# Patient Record
Sex: Female | Born: 1955 | Race: White | Hispanic: No | Marital: Married | Smoking: Never smoker
Health system: Southern US, Community
[De-identification: ages and names within clinical notes are randomized; demographics above are authoritative.]

---

## 1998-01-14 ENCOUNTER — Other Ambulatory Visit: Admission: RE | Admit: 1998-01-14 | Discharge: 1998-01-14 | Payer: Self-pay | Admitting: Gynecology

## 1999-04-12 ENCOUNTER — Other Ambulatory Visit: Admission: RE | Admit: 1999-04-12 | Discharge: 1999-04-12 | Payer: Self-pay | Admitting: Gynecology

## 2000-04-03 ENCOUNTER — Other Ambulatory Visit: Admission: RE | Admit: 2000-04-03 | Discharge: 2000-04-03 | Payer: Self-pay | Admitting: Gynecology

## 2001-08-27 ENCOUNTER — Other Ambulatory Visit: Admission: RE | Admit: 2001-08-27 | Discharge: 2001-08-27 | Payer: Self-pay | Admitting: Gynecology

## 2002-09-04 ENCOUNTER — Other Ambulatory Visit: Admission: RE | Admit: 2002-09-04 | Discharge: 2002-09-04 | Payer: Self-pay | Admitting: Gynecology

## 2002-09-17 ENCOUNTER — Emergency Department (HOSPITAL_COMMUNITY): Admission: EM | Admit: 2002-09-17 | Discharge: 2002-09-17 | Payer: Self-pay | Admitting: Emergency Medicine

## 2002-09-17 ENCOUNTER — Encounter: Payer: Self-pay | Admitting: Emergency Medicine

## 2003-09-17 ENCOUNTER — Other Ambulatory Visit: Admission: RE | Admit: 2003-09-17 | Discharge: 2003-09-17 | Payer: Self-pay | Admitting: Gynecology

## 2005-02-02 ENCOUNTER — Other Ambulatory Visit: Admission: RE | Admit: 2005-02-02 | Discharge: 2005-02-02 | Payer: Self-pay | Admitting: Gynecology

## 2008-05-11 ENCOUNTER — Other Ambulatory Visit: Admission: RE | Admit: 2008-05-11 | Discharge: 2008-05-11 | Payer: Self-pay | Admitting: Family Medicine

## 2009-08-11 ENCOUNTER — Encounter: Admission: RE | Admit: 2009-08-11 | Discharge: 2009-08-11 | Payer: Self-pay | Admitting: Family Medicine

## 2009-08-25 ENCOUNTER — Encounter: Admission: RE | Admit: 2009-08-25 | Discharge: 2009-08-25 | Payer: Self-pay | Admitting: Family Medicine

## 2011-09-06 ENCOUNTER — Other Ambulatory Visit: Payer: Self-pay | Admitting: Family Medicine

## 2011-09-06 ENCOUNTER — Other Ambulatory Visit (HOSPITAL_COMMUNITY)
Admission: RE | Admit: 2011-09-06 | Discharge: 2011-09-06 | Disposition: A | Discharge status: Home / Self Care | Payer: BC Managed Care – PPO | Source: Ambulatory Visit | Attending: Family Medicine | Admitting: Family Medicine

## 2011-09-06 DIAGNOSIS — Z124 Encounter for screening for malignant neoplasm of cervix: Secondary | ICD-10-CM | POA: Insufficient documentation

## 2014-10-05 ENCOUNTER — Other Ambulatory Visit (HOSPITAL_COMMUNITY)
Admission: RE | Admit: 2014-10-05 | Discharge: 2014-10-05 | Disposition: A | Discharge status: Home / Self Care | Payer: BLUE CROSS/BLUE SHIELD | Source: Ambulatory Visit | Attending: Family Medicine | Admitting: Family Medicine

## 2014-10-05 ENCOUNTER — Other Ambulatory Visit: Payer: Self-pay | Admitting: Family Medicine

## 2014-10-05 DIAGNOSIS — Z124 Encounter for screening for malignant neoplasm of cervix: Secondary | ICD-10-CM | POA: Diagnosis present

## 2014-10-06 LAB — CYTOLOGY - PAP

## 2016-01-05 ENCOUNTER — Other Ambulatory Visit: Payer: Self-pay | Admitting: Family Medicine

## 2016-01-05 ENCOUNTER — Other Ambulatory Visit (HOSPITAL_COMMUNITY)
Admission: RE | Admit: 2016-01-05 | Discharge: 2016-01-05 | Disposition: A | Discharge status: Home / Self Care | Payer: BLUE CROSS/BLUE SHIELD | Source: Ambulatory Visit | Attending: Family Medicine | Admitting: Family Medicine

## 2016-01-05 DIAGNOSIS — Z124 Encounter for screening for malignant neoplasm of cervix: Secondary | ICD-10-CM | POA: Diagnosis not present

## 2016-01-07 LAB — CYTOLOGY - PAP

## 2017-06-06 DIAGNOSIS — H6122 Impacted cerumen, left ear: Secondary | ICD-10-CM | POA: Diagnosis not present

## 2017-06-06 DIAGNOSIS — H6982 Other specified disorders of Eustachian tube, left ear: Secondary | ICD-10-CM | POA: Diagnosis not present

## 2017-08-03 DIAGNOSIS — H9210 Otorrhea, unspecified ear: Secondary | ICD-10-CM | POA: Diagnosis not present

## 2017-08-03 DIAGNOSIS — H9192 Unspecified hearing loss, left ear: Secondary | ICD-10-CM | POA: Diagnosis not present

## 2017-08-03 DIAGNOSIS — H6122 Impacted cerumen, left ear: Secondary | ICD-10-CM | POA: Diagnosis not present

## 2017-08-03 DIAGNOSIS — F419 Anxiety disorder, unspecified: Secondary | ICD-10-CM | POA: Diagnosis not present

## 2017-08-03 DIAGNOSIS — J3489 Other specified disorders of nose and nasal sinuses: Secondary | ICD-10-CM | POA: Diagnosis not present

## 2017-08-03 DIAGNOSIS — B369 Superficial mycosis, unspecified: Secondary | ICD-10-CM | POA: Diagnosis not present

## 2017-08-03 DIAGNOSIS — H6240 Otitis externa in other diseases classified elsewhere, unspecified ear: Secondary | ICD-10-CM | POA: Diagnosis not present

## 2017-08-27 DIAGNOSIS — B369 Superficial mycosis, unspecified: Secondary | ICD-10-CM | POA: Diagnosis not present

## 2017-08-27 DIAGNOSIS — J342 Deviated nasal septum: Secondary | ICD-10-CM | POA: Diagnosis not present

## 2017-08-27 DIAGNOSIS — H6983 Other specified disorders of Eustachian tube, bilateral: Secondary | ICD-10-CM | POA: Diagnosis not present

## 2017-08-27 DIAGNOSIS — H938X3 Other specified disorders of ear, bilateral: Secondary | ICD-10-CM | POA: Diagnosis not present

## 2017-08-27 DIAGNOSIS — J343 Hypertrophy of nasal turbinates: Secondary | ICD-10-CM | POA: Diagnosis not present

## 2017-11-02 DIAGNOSIS — Z1159 Encounter for screening for other viral diseases: Secondary | ICD-10-CM | POA: Diagnosis not present

## 2017-11-02 DIAGNOSIS — J309 Allergic rhinitis, unspecified: Secondary | ICD-10-CM | POA: Diagnosis not present

## 2017-11-02 DIAGNOSIS — F419 Anxiety disorder, unspecified: Secondary | ICD-10-CM | POA: Diagnosis not present

## 2017-11-02 DIAGNOSIS — M8588 Other specified disorders of bone density and structure, other site: Secondary | ICD-10-CM | POA: Diagnosis not present

## 2017-11-02 DIAGNOSIS — E559 Vitamin D deficiency, unspecified: Secondary | ICD-10-CM | POA: Diagnosis not present

## 2017-11-02 DIAGNOSIS — Z0000 Encounter for general adult medical examination without abnormal findings: Secondary | ICD-10-CM | POA: Diagnosis not present

## 2017-11-07 ENCOUNTER — Other Ambulatory Visit: Payer: Self-pay | Admitting: Family Medicine

## 2017-11-07 DIAGNOSIS — N6459 Other signs and symptoms in breast: Secondary | ICD-10-CM

## 2017-11-08 DIAGNOSIS — H538 Other visual disturbances: Secondary | ICD-10-CM | POA: Diagnosis not present

## 2017-11-08 DIAGNOSIS — H5712 Ocular pain, left eye: Secondary | ICD-10-CM | POA: Diagnosis not present

## 2017-11-08 DIAGNOSIS — H16142 Punctate keratitis, left eye: Secondary | ICD-10-CM | POA: Diagnosis not present

## 2017-11-13 ENCOUNTER — Ambulatory Visit: Payer: BLUE CROSS/BLUE SHIELD

## 2017-11-13 ENCOUNTER — Other Ambulatory Visit: Payer: Self-pay | Admitting: Family Medicine

## 2017-11-13 ENCOUNTER — Ambulatory Visit
Admission: RE | Admit: 2017-11-13 | Discharge: 2017-11-13 | Disposition: A | Discharge status: Home / Self Care | Payer: BLUE CROSS/BLUE SHIELD | Source: Ambulatory Visit | Attending: Family Medicine | Admitting: Family Medicine

## 2017-11-13 DIAGNOSIS — N6489 Other specified disorders of breast: Secondary | ICD-10-CM | POA: Diagnosis not present

## 2017-11-13 DIAGNOSIS — N6459 Other signs and symptoms in breast: Secondary | ICD-10-CM

## 2017-11-13 DIAGNOSIS — R928 Other abnormal and inconclusive findings on diagnostic imaging of breast: Secondary | ICD-10-CM | POA: Diagnosis not present

## 2017-11-15 DIAGNOSIS — H16142 Punctate keratitis, left eye: Secondary | ICD-10-CM | POA: Diagnosis not present

## 2018-01-21 DIAGNOSIS — H6122 Impacted cerumen, left ear: Secondary | ICD-10-CM | POA: Diagnosis not present

## 2018-01-21 DIAGNOSIS — H60312 Diffuse otitis externa, left ear: Secondary | ICD-10-CM | POA: Diagnosis not present

## 2018-01-25 DIAGNOSIS — Z1231 Encounter for screening mammogram for malignant neoplasm of breast: Secondary | ICD-10-CM | POA: Diagnosis not present

## 2018-02-04 DIAGNOSIS — H5789 Other specified disorders of eye and adnexa: Secondary | ICD-10-CM | POA: Diagnosis not present

## 2018-02-04 DIAGNOSIS — H60312 Diffuse otitis externa, left ear: Secondary | ICD-10-CM | POA: Diagnosis not present

## 2018-02-05 DIAGNOSIS — J343 Hypertrophy of nasal turbinates: Secondary | ICD-10-CM | POA: Diagnosis not present

## 2018-02-05 DIAGNOSIS — J342 Deviated nasal septum: Secondary | ICD-10-CM | POA: Diagnosis not present

## 2018-02-05 DIAGNOSIS — H6090 Unspecified otitis externa, unspecified ear: Secondary | ICD-10-CM | POA: Diagnosis not present

## 2018-02-05 DIAGNOSIS — H9212 Otorrhea, left ear: Secondary | ICD-10-CM | POA: Diagnosis not present

## 2018-02-22 DIAGNOSIS — H9212 Otorrhea, left ear: Secondary | ICD-10-CM | POA: Diagnosis not present

## 2018-03-12 ENCOUNTER — Other Ambulatory Visit: Payer: Self-pay | Admitting: Otolaryngology

## 2018-03-12 DIAGNOSIS — H9212 Otorrhea, left ear: Secondary | ICD-10-CM

## 2018-03-15 ENCOUNTER — Ambulatory Visit
Admission: RE | Admit: 2018-03-15 | Discharge: 2018-03-15 | Disposition: A | Discharge status: Home / Self Care | Payer: BLUE CROSS/BLUE SHIELD | Source: Ambulatory Visit | Attending: Otolaryngology | Admitting: Otolaryngology

## 2018-03-15 DIAGNOSIS — H9212 Otorrhea, left ear: Secondary | ICD-10-CM

## 2018-03-15 DIAGNOSIS — H748X3 Other specified disorders of middle ear and mastoid, bilateral: Secondary | ICD-10-CM | POA: Diagnosis not present

## 2018-03-22 DIAGNOSIS — H9212 Otorrhea, left ear: Secondary | ICD-10-CM | POA: Diagnosis not present

## 2018-04-03 ENCOUNTER — Ambulatory Visit (INDEPENDENT_AMBULATORY_CARE_PROVIDER_SITE_OTHER): Payer: BLUE CROSS/BLUE SHIELD | Admitting: Podiatry

## 2018-04-03 ENCOUNTER — Ambulatory Visit (INDEPENDENT_AMBULATORY_CARE_PROVIDER_SITE_OTHER): Payer: BLUE CROSS/BLUE SHIELD

## 2018-04-03 ENCOUNTER — Encounter: Payer: Self-pay | Admitting: Podiatry

## 2018-04-03 VITALS — BP 131/64 | HR 63

## 2018-04-03 DIAGNOSIS — M2041 Other hammer toe(s) (acquired), right foot: Secondary | ICD-10-CM | POA: Diagnosis not present

## 2018-04-03 DIAGNOSIS — M2012 Hallux valgus (acquired), left foot: Secondary | ICD-10-CM

## 2018-04-03 DIAGNOSIS — M2042 Other hammer toe(s) (acquired), left foot: Secondary | ICD-10-CM | POA: Diagnosis not present

## 2018-04-03 DIAGNOSIS — M2011 Hallux valgus (acquired), right foot: Secondary | ICD-10-CM

## 2018-04-03 NOTE — Patient Instructions (Addendum)
Bunion A bunion is a bump on the base of the big toe that forms when the bones of the big toe joint move out of position. Bunions may be small at first, but they often get larger over time. The can make walking painful. What are the causes? A bunion may be caused by:  Wearing narrow or pointed shoes that force the big toe to press against the other toes.  Abnormal foot development that causes the foot to roll inward (pronate).  Changes in the foot that are caused by certain diseases, such as rheumatoid arthritis and polio.  A foot injury.  What increases the risk? The following factors may make you more likely to develop this condition:  Wearing shoes that squeeze the toes together.  Having certain diseases, such as: ? Rheumatoid arthritis. ? Polio. ? Cerebral palsy.  Having family members who have bunions.  Being born with a foot deformity, such as flat feet or low arches.  Doing activities that put a lot of pressure on the feet, such as ballet dancing.  What are the signs or symptoms? The main symptom of a bunion is a noticeable bump on the big toe. Other symptoms may include:  Pain.  Swelling around the big toe.  Redness and inflammation.  Thick or hardened skin on the big toe or between the toes.  Stiffness or loss of motion in the big toe.  Trouble with walking.  How is this diagnosed? A bunion may be diagnosed based on your symptoms, medical history, and activities. You may have tests, such as:  X-rays. These allow your health care provider to check the position of the bones in your foot and look for damage to your joint. They also help your health care provider to determine the severity of your bunion and the best way to treat it.  Joint aspiration. In this test, a sample of fluid is removed from the toe joint. This test, which may be done if you are in a lot of pain, helps to rule out diseases that cause painful swelling of the joints, such as  arthritis.  How is this treated? There is no cure for a bunion, but treatment can help to prevent a bunion from getting worse. Treatment depends on the severity of your symptoms. Your health care provider may recommend:  Wearing shoes that have a wide toe box.  Using bunion pads to cushion the affected area.  Taping your toes together to keep them in a normal position.  Placing a device inside your shoe (orthotics) to help reduce pressure on your toe joint.  Taking medicine to ease pain, inflammation, and swelling.  Applying heat or ice to the affected area.  Doing stretching exercises.  Surgery to remove scar tissue and move the toes back into their normal position. This treatment is rare.  Follow these instructions at home:  Support your toe joint with proper footwear, shoe padding, or taping as told by your health care provider.  Take over-the-counter and prescription medicines only as told by your health care provider.  If directed, apply ice to the injured area: ? Put ice in a plastic bag. ? Place a towel between your skin and the bag. ? Leave the ice on for 20 minutes, 2-3 times per day.  If directed, apply heat to the affected area before you exercise. Use the heat source that your health care provider recommends, such as a moist heat pack or a heating pad. ? Place a towel between your   skin and the heat source. ? Leave the heat on for 20-30 minutes. ? Remove the heat if your skin turns bright red. This is especially important if you are unable to feel pain, heat, or cold. You may have a greater risk of getting burned.  Do exercises as told by your health care provider.  Keep all follow-up visits as told by your health care provider. Contact a health care provider if:  Your symptoms get worse.  Your symptoms do not improve in 2 weeks. Get help right away if:  You have severe pain and trouble with walking. This information is not intended to replace advice given  to you by your health care provider. Make sure you discuss any questions you have with your health care provider. Document Released: 05/29/2005 Document Revised: 11/04/2015 Document Reviewed: 12/27/2014 Elsevier Interactive Patient Education  2018 [employer]  Pre-Operative Instructions  Congratulations, you have decided to take an important step towards improving your quality of life.  You can be assured that the doctors and staff at [facility] will be with you every step of the way.  Here are some important things you should know:  1. Plan to be at the surgery center/hospital at least 1 (one) hour prior to your scheduled time, unless otherwise directed by the surgical center/hospital staff.  You must have a responsible adult accompany you, remain during the surgery and drive you home.  Make sure you have directions to the surgical center/hospital to ensure you arrive on time. 2. If you are having surgery at [facility] or [facility] hospitals, you will need a copy of your medical history and physical form from your family physician within one month prior to the date of surgery. We will give you a form for your primary physician to complete.  3. We make every effort to accommodate the date you request for surgery.  However, there are times where surgery dates or times have to be moved.  We will contact you as soon as possible if a change in schedule is required.   4. No aspirin/ibuprofen for one week before surgery.  If you are on aspirin, any non-steroidal anti-inflammatory medications (Mobic, Aleve, Ibuprofen) should not be taken seven (7) days prior to your surgery.  You make take Tylenol for pain prior to surgery.  5. Medications - If you are taking daily heart and blood pressure medications, seizure, reflux, allergy, asthma, anxiety, pain or diabetes medications, make sure you notify the surgery center/hospital before the day of surgery so they can tell you which medications you should  take or avoid the day of surgery. 6. No food or drink after midnight the night before surgery unless directed otherwise by surgical center/hospital staff. 7. No alcoholic beverages 24-hours prior to surgery.  No smoking 24-hours prior or 24-hours after surgery. 8. Wear loose pants or shorts. They should be loose enough to fit over bandages, boots, and casts. 9. Don't wear slip-on shoes. Sneakers are preferred. 10. Bring your boot with you to the surgery center/hospital.  Also bring crutches or a walker if your physician has prescribed it for you.  If you do not have this equipment, it will be provided for you after surgery. 11. If you have not been contacted by the surgery center/hospital by the day before your surgery, call to confirm the date and time of your surgery. 12. Leave-time from work may vary depending on the type of surgery you have.  Appropriate arrangements should be made prior   to surgery with your employer. 13. Prescriptions will be provided immediately following surgery by your doctor.  Fill these as soon as possible after surgery and take the medication as directed. Pain medications will not be refilled on weekends and must be approved by the doctor. 14. Remove nail polish on the operative foot and avoid getting pedicures prior to surgery. 15. Wash the night before surgery.  The night before surgery wash the foot and leg well with water and the antibacterial soap provided. Be sure to pay special attention to beneath the toenails and in between the toes.  Wash for at least three (3) minutes. Rinse thoroughly with water and dry well with a towel.  Perform this wash unless told not to do so by your physician.  Enclosed: 1 Ice pack (please put in freezer the night before surgery)   1 Hibiclens skin cleaner   Pre-op instructions  If you have any questions regarding the instructions, please do not hesitate to call our office.  [place]: [address], [place], [place] [address] --  [phone]  [place]: [address] [place], [place] [address] -- [phone]  [place]: [address]  [place], [place] [address] -- [phone]  [place]: [address], [address], [place],  [address] -- [phone]  Website: [email or web address]  

## 2018-04-04 NOTE — Progress Notes (Signed)
Subjective:   Patient ID: Phillips Odor, female   DOB: 62 y.o.   MRN: [id number]   HPI Patient states she is got chronic bunion deformity of both feet that are increasingly becoming difficult to wear shoe gear with with significant family history of condition and history of having correction of the right foot 19 years ago which has not held up and is giving her problems.  Patient also severe rotation of the right big toe digital deformities of the fourth and fifth digits bilateral that are irritated to create callus formation.  Patient does not smoke and likes to be active   Review of Systems  All other systems reviewed and are negative.       Objective:  Physical Exam  Constitutional: She appears well-developed and well-nourished.  Cardiovascular: Intact distal pulses.  Pulmonary/Chest: Effort normal.  Musculoskeletal: Normal range of motion.  Neurological: She is alert.  Skin: Skin is warm.  Nursing note and vitals reviewed.   Neurovascular status found to be intact muscle strength is adequate range of motion within normal limits and patient is noted to have significant structural bunion deformity bilateral with redness around the first metatarsal and distal rotation digit for bilateral and keratotic lesion digit 5 bilateral with rotation of the toes.  Patient's right hallux has significant frontal transverse plane deformity and there is an incision site on the right first metatarsal from previous surgery.  Patient has good digital perfusion and is well oriented x3     Assessment:  Significant structural bunion deformity right over left with previous surgery right foot 19 years ago which has not been successful with significant hallux interphalangeus deformity right and hammertoe deformity fourth and fifth digits both feet     Plan:  H&P and conditions reviewed at great length.  Patient needs to get this done as soon as possible as she wants to get them both done by the end of the  year due to her schedule and she would like to have her feet corrected.  I did recommend that this could be done but will need to put her on a fairly fast track and due to the fact I did allow her to read consent form concerning correction of left foot first.  I discussed aggressive distal osteotomy along with hammertoe repair fourth and fifth left and the fact that on the right foot she will be nonweightbearing 4 to 6 weeks and it will require a proximal fusion.  Patient understands all of this and wants surgery first on the left foot and after extensive review and review of alternative treatments complications she signed consent form.  She is scheduled for outpatient surgery and does understand total recovery take 6 months to 1 year and there is no long-term guarantees that correction will hold off and also at this time I did dispense air fracture walker with instructions on usage and I want her to get used to it prior to procedure.  Patient is encouraged to call with any questions concerns she may have prior to procedure and she is scheduled next week for her surgical procedure of the left foot order to be able to get them both done by the end of the year  X-ray indicates that there is significant elevation of the intermetatarsal angle right over left with previous screw first metatarsal right foot and digital deformity of the fourth and fifth toes with rotational component along with severe inner phalangeal deformity right

## 2018-04-09 ENCOUNTER — Encounter: Payer: Self-pay | Admitting: Podiatry

## 2018-04-09 DIAGNOSIS — M2042 Other hammer toe(s) (acquired), left foot: Secondary | ICD-10-CM | POA: Diagnosis not present

## 2018-04-09 DIAGNOSIS — M2012 Hallux valgus (acquired), left foot: Secondary | ICD-10-CM | POA: Diagnosis not present

## 2018-04-09 DIAGNOSIS — M21612 Bunion of left foot: Secondary | ICD-10-CM | POA: Diagnosis not present

## 2018-04-09 DIAGNOSIS — Z01818 Encounter for other preprocedural examination: Secondary | ICD-10-CM | POA: Diagnosis not present

## 2018-04-17 ENCOUNTER — Other Ambulatory Visit: Payer: BLUE CROSS/BLUE SHIELD

## 2018-04-19 ENCOUNTER — Encounter: Payer: Self-pay | Admitting: Podiatry

## 2018-04-19 ENCOUNTER — Ambulatory Visit (INDEPENDENT_AMBULATORY_CARE_PROVIDER_SITE_OTHER): Payer: BLUE CROSS/BLUE SHIELD

## 2018-04-19 ENCOUNTER — Ambulatory Visit (INDEPENDENT_AMBULATORY_CARE_PROVIDER_SITE_OTHER): Payer: BLUE CROSS/BLUE SHIELD | Admitting: Podiatry

## 2018-04-19 DIAGNOSIS — M2012 Hallux valgus (acquired), left foot: Secondary | ICD-10-CM

## 2018-04-21 NOTE — Progress Notes (Signed)
Subjective:   Patient ID: Phillips Odor, female   DOB: 62 y.o.   MRN: [id number]   HPI Patient states overall doing well with her foot with minimal discomfort and states that she is able to walk without significant pain   ROS      Objective:  Physical Exam  Neurovascular status intact with patient's left foot healing well with wound edges well coapted digits in good alignment hallux in rectus position with severe structural deformity of the right foot     Assessment:  Doing well post forefoot reconstruction left with negative Homans sign noted wound edges well coapted in good alignment     Plan:  H&P and continue to advise on elevation compression immobilization applied sterile dressing left and discussed correction of the right which she needs to get done by the end of the year due to her insurance.  I discussed a Lapidus fusion with possible Aiken osteotomy and digital procedures fourth and fifth toes and explained the difficulty of her right foot and the fact will be revisional surgery.  Patient wants surgery and is scheduled for the end of the month and will reappoint for consult 2 weeks  X-ray indicates the osteotomy left is healing well and the digits are in good alignment

## 2018-05-01 ENCOUNTER — Ambulatory Visit (INDEPENDENT_AMBULATORY_CARE_PROVIDER_SITE_OTHER): Payer: BLUE CROSS/BLUE SHIELD

## 2018-05-01 ENCOUNTER — Ambulatory Visit (INDEPENDENT_AMBULATORY_CARE_PROVIDER_SITE_OTHER): Payer: BLUE CROSS/BLUE SHIELD | Admitting: Podiatry

## 2018-05-01 DIAGNOSIS — M2012 Hallux valgus (acquired), left foot: Secondary | ICD-10-CM

## 2018-05-01 DIAGNOSIS — M2041 Other hammer toe(s) (acquired), right foot: Secondary | ICD-10-CM

## 2018-05-01 DIAGNOSIS — M2011 Hallux valgus (acquired), right foot: Secondary | ICD-10-CM

## 2018-05-01 DIAGNOSIS — M2042 Other hammer toe(s) (acquired), left foot: Secondary | ICD-10-CM

## 2018-05-01 NOTE — Patient Instructions (Signed)
Pre-Operative Instructions  Congratulations, you have decided to take an important step towards improving your quality of life.  You can be assured that the doctors and staff at [facility] will be with you every step of the way.  Here are some important things you should know:  1. Plan to be at the surgery center/hospital at least 1 (one) hour prior to your scheduled time, unless otherwise directed by the surgical center/hospital staff.  You must have a responsible adult accompany you, remain during the surgery and drive you home.  Make sure you have directions to the surgical center/hospital to ensure you arrive on time. 2. If you are having surgery at [facility] or [facility] hospitals, you will need a copy of your medical history and physical form from your family physician within one month prior to the date of surgery. We will give you a form for your primary physician to complete.  3. We make every effort to accommodate the date you request for surgery.  However, there are times where surgery dates or times have to be moved.  We will contact you as soon as possible if a change in schedule is required.   4. No aspirin/ibuprofen for one week before surgery.  If you are on aspirin, any non-steroidal anti-inflammatory medications (Mobic, Aleve, Ibuprofen) should not be taken seven (7) days prior to your surgery.  You make take Tylenol for pain prior to surgery.  5. Medications - If you are taking daily heart and blood pressure medications, seizure, reflux, allergy, asthma, anxiety, pain or diabetes medications, make sure you notify the surgery center/hospital before the day of surgery so they can tell you which medications you should take or avoid the day of surgery. 6. No food or drink after midnight the night before surgery unless directed otherwise by surgical center/hospital staff. 7. No alcoholic beverages 24-hours prior to surgery.  No smoking 24-hours prior or 24-hours after  surgery. 8. Wear loose pants or shorts. They should be loose enough to fit over bandages, boots, and casts. 9. Don't wear slip-on shoes. Sneakers are preferred. 10. Bring your boot with you to the surgery center/hospital.  Also bring crutches or a walker if your physician has prescribed it for you.  If you do not have this equipment, it will be provided for you after surgery. 11. If you have not been contacted by the surgery center/hospital by the day before your surgery, call to confirm the date and time of your surgery. 12. Leave-time from work may vary depending on the type of surgery you have.  Appropriate arrangements should be made prior to surgery with your employer. 13. Prescriptions will be provided immediately following surgery by your doctor.  Fill these as soon as possible after surgery and take the medication as directed. Pain medications will not be refilled on weekends and must be approved by the doctor. 14. Remove nail polish on the operative foot and avoid getting pedicures prior to surgery. 15. Wash the night before surgery.  The night before surgery wash the foot and leg well with water and the antibacterial soap provided. Be sure to pay special attention to beneath the toenails and in between the toes.  Wash for at least three (3) minutes. Rinse thoroughly with water and dry well with a towel.  Perform this wash unless told not to do so by your physician.  Enclosed: 1 Ice pack (please put in freezer the night before surgery)   1 Hibiclens skin cleaner     Pre-op instructions  If you have any questions regarding the instructions, please do not hesitate to call our office.  [place]: [address], [place], [place] [address] -- [phone]  [place]: [address] [place], [place] [address] -- [phone]  [place]: [address]  [place], [place] [address] -- [phone]  [place]: [address], [place], [place] [address] -- [phone]  Website:  [email or web address] 

## 2018-05-02 NOTE — Progress Notes (Signed)
Subjective:   Patient ID: Anna Parsons, female   DOB: 62 y.o.   MRN: [id number]   HPI Patient presents stating my left foot feels really good and I am excited to get my right foot fixed and I am walking well with minimal discomfort   ROS      Objective:  Physical Exam  Neurovascular status intact with hallux in rectus position left digits in good alignment wound edges well coapted stitches in place with good range of motion first MPJ and negative Homans sign     Assessment:  HAV deformity healed well left with severe deformity right foot and hammertoe deformity right     Plan:  H&P conditions reviewed and discussed correction of the right foot and we reviewed a Lapidus fusion along with derotational probable Akin osteotomy and arthroplasty fourth and fifth toes.  I did explain there is no guarantee as far as his relief with this and that it is a very difficult problem is revisional surgery and that it may not heal well and that it may require other treatments.  Patient will be nonweightbearing on the left foot for approximately 4 weeks and again understands will be a different recovery than the right foot and that it is difficult for us to be able to put it into perfect position due to the revisional nature of the surgery.  She is amenable to surgery signed consent form is scheduled for outpatient surgery and for the left foot she may begin wearing the soft shoe over the next 2 weeks  X-ray indicates that the osteotomy left is healing well fixation is in place toes in good alignment

## 2018-05-07 ENCOUNTER — Encounter: Payer: Self-pay | Admitting: Podiatry

## 2018-05-07 DIAGNOSIS — M2021 Hallux rigidus, right foot: Secondary | ICD-10-CM

## 2018-05-07 DIAGNOSIS — M1380 Other specified arthritis, unspecified site: Secondary | ICD-10-CM | POA: Diagnosis not present

## 2018-05-07 DIAGNOSIS — M2011 Hallux valgus (acquired), right foot: Secondary | ICD-10-CM

## 2018-05-07 DIAGNOSIS — Z472 Encounter for removal of internal fixation device: Secondary | ICD-10-CM | POA: Diagnosis not present

## 2018-05-07 DIAGNOSIS — Z4889 Encounter for other specified surgical aftercare: Secondary | ICD-10-CM

## 2018-05-07 DIAGNOSIS — M205X1 Other deformities of toe(s) (acquired), right foot: Secondary | ICD-10-CM | POA: Diagnosis not present

## 2018-05-07 DIAGNOSIS — M2041 Other hammer toe(s) (acquired), right foot: Secondary | ICD-10-CM

## 2018-05-13 ENCOUNTER — Ambulatory Visit (INDEPENDENT_AMBULATORY_CARE_PROVIDER_SITE_OTHER): Payer: BLUE CROSS/BLUE SHIELD | Admitting: Podiatry

## 2018-05-13 ENCOUNTER — Encounter: Payer: Self-pay | Admitting: Podiatry

## 2018-05-13 ENCOUNTER — Ambulatory Visit (INDEPENDENT_AMBULATORY_CARE_PROVIDER_SITE_OTHER): Payer: BLUE CROSS/BLUE SHIELD

## 2018-05-13 VITALS — Temp 98.8°F

## 2018-05-13 DIAGNOSIS — M2042 Other hammer toe(s) (acquired), left foot: Secondary | ICD-10-CM

## 2018-05-13 DIAGNOSIS — M2041 Other hammer toe(s) (acquired), right foot: Secondary | ICD-10-CM

## 2018-05-13 DIAGNOSIS — M2011 Hallux valgus (acquired), right foot: Secondary | ICD-10-CM

## 2018-05-13 MED ORDER — OXYCODONE-ACETAMINOPHEN 10-325 MG PO TABS: 1.0000 | ORAL_TABLET | ORAL | 0 refills | Status: DC | PRN

## 2018-05-14 NOTE — Progress Notes (Signed)
Subjective:   Patient ID: Anna Parsons, female   DOB: 62 y.o.   MRN: [id number]   HPI Patient presents stating I am doing well with my right foot so far and I have been nonweightbearing but I am not been wearing my boot like I should.  Left foot seems to be doing pretty well sore at times   ROS      Objective:  Physical Exam  Neurovascular status intact negative Homans sign noted with wound edges well coapted and healing well.  Hallux is in a much better position and reduction of the ankle has been achieved in the fourth and fifth digits are in good position with stitches intact and left foot is healing well with wound edges well coapted good range of motion     Assessment:  Doing well after having Lapidus fusion right and Aiken osteotomy along with digital repair with bunion procedure and hammertoe repair left     Plan:  H&P reviewed all conditions and at this point continue nonweightbearing right and left continue to improve activity.  Patient overall is doing well and will be seen back 2 weeks and is encouraged to call with any questions concerns she may have  X-rays indicate fixation is in place staples and screws in place with good reduction of the ankle and alignment of the first MPJ right over left

## 2018-05-17 ENCOUNTER — Telehealth: Payer: Self-pay | Admitting: Podiatry

## 2018-05-17 NOTE — Telephone Encounter (Signed)
I called pt and she states she had surgery in 03/2018 and then again Thanksgiving and is having night sweats. Pt denies fever or signs of infection, but states her 1st surgery she had a low grade fever and felt like a light flu, but doesn't have any of those symptoms now. I told pt, the night sweats were not related to the pain medication or the surgery, to contact her PCP.

## 2018-05-17 NOTE — Telephone Encounter (Signed)
Pt previously had surgery and is experiencing excessive night sweating and is checking to see if this is a normal side effect of surgery/pain medication. Please give pt a call.

## 2018-05-20 DIAGNOSIS — M26623 Arthralgia of bilateral temporomandibular joint: Secondary | ICD-10-CM | POA: Diagnosis not present

## 2018-05-22 ENCOUNTER — Ambulatory Visit (INDEPENDENT_AMBULATORY_CARE_PROVIDER_SITE_OTHER): Payer: BLUE CROSS/BLUE SHIELD | Admitting: Podiatry

## 2018-05-22 ENCOUNTER — Encounter: Payer: Self-pay | Admitting: Podiatry

## 2018-05-22 DIAGNOSIS — M2011 Hallux valgus (acquired), right foot: Secondary | ICD-10-CM | POA: Diagnosis not present

## 2018-05-22 DIAGNOSIS — M26623 Arthralgia of bilateral temporomandibular joint: Secondary | ICD-10-CM | POA: Diagnosis not present

## 2018-05-22 NOTE — Progress Notes (Signed)
Subjective:   Patient ID: Anna Parsons, female   DOB: 62 y.o.   MRN: [id number]   HPI Patient states doing real well with surgery with minimal discomfort and has been nonweightbearing right foot   ROS      Objective:  Physical Exam  Neurovascular status intact negative Homans sign noted with patient's right foot healing very well stitches intact fourth and fifth digits wound edges well coapted with good alignment and excellent healing left foot     Assessment:  Doing well post foot surgery bilateral with Lapidus fusion right     Plan:  Stitches removed fourth and fifth digits applied sterile dressing dispensed Ace wrap right and compression stocking left continue nonweightbearing right and reappoint in approximately 3 weeks or earlier if needed

## 2018-05-24 ENCOUNTER — Other Ambulatory Visit: Payer: Self-pay

## 2018-05-24 NOTE — Progress Notes (Signed)
DOS  05/07/2018  1. Aiken osteotomy right foot, lapidus bunionectomy with fixation. 2. Hammertoe repair distal fourth right,  Hammertoe repair fifth right. 3. Removal of painful hardware right foot.

## 2018-06-14 ENCOUNTER — Ambulatory Visit (INDEPENDENT_AMBULATORY_CARE_PROVIDER_SITE_OTHER): Payer: BLUE CROSS/BLUE SHIELD | Admitting: Podiatry

## 2018-06-14 ENCOUNTER — Ambulatory Visit (INDEPENDENT_AMBULATORY_CARE_PROVIDER_SITE_OTHER): Payer: BLUE CROSS/BLUE SHIELD

## 2018-06-14 ENCOUNTER — Encounter: Payer: Self-pay | Admitting: Podiatry

## 2018-06-14 DIAGNOSIS — M2041 Other hammer toe(s) (acquired), right foot: Secondary | ICD-10-CM | POA: Diagnosis not present

## 2018-06-14 NOTE — Patient Instructions (Signed)

## 2018-06-15 NOTE — Progress Notes (Signed)
Subjective:   Patient ID: Phillips Odor, female   DOB: 63 y.o.   MRN: [id number]   HPI Patient states she is very happy with the right foot and she wants to start wearing shoe gear and she would like to be able to start bearing weight on it also knows that she needs to get her ingrown toenails fixed in the next several weeks   ROS      Objective:  Physical Exam  Neurovascular status intact negative Homans sign noted with patient having good structural position of the first metatarsal bilateral with good range of motion no crepitus of the joint and position of the hallux in a much better position than preoperatively.  Incurvated hallux nail bilateral medial lateral borders that are painful     Assessment:  Doing well post foot construction right and left foot with nonweightbearing status still present right with ingrown toenail deformity bilateral     Plan:  H&P conditions reviewed and recommended ingrown toenail correction.  Explained procedures and risk and patient wants surgery and will have this done in several weeks.  I then went ahead and we will allow her to gradually begin bearing weight on the right foot and she was instructed on how to do this over the next couple weeks and will reappoint again 2 weeks or earlier if needed  X-ray indicates osteotomies healing well with fixation in place screw and staples with no indications of significant pathology

## 2018-06-28 ENCOUNTER — Ambulatory Visit: Payer: BLUE CROSS/BLUE SHIELD | Admitting: Podiatry

## 2018-07-04 ENCOUNTER — Encounter: Payer: Self-pay | Admitting: Podiatry

## 2018-07-04 ENCOUNTER — Ambulatory Visit (INDEPENDENT_AMBULATORY_CARE_PROVIDER_SITE_OTHER): Payer: BLUE CROSS/BLUE SHIELD | Admitting: Podiatry

## 2018-07-04 DIAGNOSIS — L600 Ingrowing nail: Secondary | ICD-10-CM | POA: Diagnosis not present

## 2018-07-04 MED ORDER — NEOMYCIN-POLYMYXIN-HC 3.5-10000-1 OT SOLN: OTIC | 1 refills | Status: AC

## 2018-07-04 NOTE — Patient Instructions (Signed)

## 2018-07-04 NOTE — Progress Notes (Signed)
Subjective:   Patient ID: Anna Parsons, female   DOB: 63 y.o.   MRN: [id number]   HPI Patient states her feet are feeling real good and she is ready to get her ingrown toenails fixed on both feet which is been chronic and painful.  Patient states overall she is so happy with her foot surgery   ROS      Objective:  Physical Exam  Neurovascular status intact muscle strength is adequate patient found to have incurvated medial lateral borders hallux bilateral with patient having no redness or drainage noted.     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  Today I infiltrated each hallux 60 mg like Marcaine mixture sterile prep applied to the toe removed medial lateral borders exposed matrix and applied phenol 3 applications 36 followed by alcohol bottles and sterile dressing.  Gave instructions for soaks and instructed to leave dressings on 24 hours take them off earlier if any symptoms were to occur and wrote prescription for antibiotic solution and advised this patient to call with any issues should occur will be checked back in the next several weeks

## 2018-07-18 DIAGNOSIS — H8110 Benign paroxysmal vertigo, unspecified ear: Secondary | ICD-10-CM | POA: Diagnosis not present

## 2018-07-19 DIAGNOSIS — M26603 Bilateral temporomandibular joint disorder, unspecified: Secondary | ICD-10-CM | POA: Diagnosis not present

## 2018-07-19 DIAGNOSIS — M26629 Arthralgia of temporomandibular joint, unspecified side: Secondary | ICD-10-CM | POA: Diagnosis not present

## 2018-07-19 DIAGNOSIS — M26609 Unspecified temporomandibular joint disorder, unspecified side: Secondary | ICD-10-CM | POA: Diagnosis not present

## 2018-07-25 ENCOUNTER — Ambulatory Visit (INDEPENDENT_AMBULATORY_CARE_PROVIDER_SITE_OTHER): Payer: BLUE CROSS/BLUE SHIELD

## 2018-07-25 ENCOUNTER — Other Ambulatory Visit: Payer: Self-pay | Admitting: Podiatry

## 2018-07-25 ENCOUNTER — Ambulatory Visit (INDEPENDENT_AMBULATORY_CARE_PROVIDER_SITE_OTHER): Payer: BLUE CROSS/BLUE SHIELD | Admitting: Podiatry

## 2018-07-25 ENCOUNTER — Encounter: Payer: Self-pay | Admitting: Podiatry

## 2018-07-25 DIAGNOSIS — M2011 Hallux valgus (acquired), right foot: Secondary | ICD-10-CM

## 2018-07-25 DIAGNOSIS — M2012 Hallux valgus (acquired), left foot: Secondary | ICD-10-CM

## 2018-07-25 DIAGNOSIS — M2041 Other hammer toe(s) (acquired), right foot: Secondary | ICD-10-CM

## 2018-07-25 DIAGNOSIS — M2042 Other hammer toe(s) (acquired), left foot: Principal | ICD-10-CM

## 2018-07-25 NOTE — Progress Notes (Signed)
Subjective:   Patient ID: Anna Parsons, female   DOB: 63 y.o.   MRN: [id number]   HPI Patient states overall she is doing really well and states that she is very happy with the ingrown toenail removal and she still getting some swelling in her right foot but is continuing to improve gradually   ROS      Objective:  Physical Exam  Neurovascular status intact with patient's feet healing well wound edges well coapted good range of motion first MPJ bilateral with well crusted nail sites hallux bilateral     Assessment:  Doing well post ingrown toenail removal bilateral along with extensive foot surgery bilateral     Plan:  H&P and x-rays reviewed conditions.  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

## 2018-09-05 ENCOUNTER — Ambulatory Visit (INDEPENDENT_AMBULATORY_CARE_PROVIDER_SITE_OTHER): Payer: BLUE CROSS/BLUE SHIELD | Admitting: Podiatry

## 2018-09-05 ENCOUNTER — Encounter: Payer: Self-pay | Admitting: Podiatry

## 2018-09-05 ENCOUNTER — Ambulatory Visit (INDEPENDENT_AMBULATORY_CARE_PROVIDER_SITE_OTHER): Payer: BLUE CROSS/BLUE SHIELD

## 2018-09-05 ENCOUNTER — Other Ambulatory Visit: Payer: Self-pay

## 2018-09-05 DIAGNOSIS — M2041 Other hammer toe(s) (acquired), right foot: Secondary | ICD-10-CM

## 2018-09-05 NOTE — Progress Notes (Signed)
Subjective:   Patient ID: Anna Parsons, female   DOB: 63 y.o.   MRN: [id number]   HPI Patient states overall I am doing well but is concerned I could be getting some reoccurrence on my right big toe joint stating its not hurting and has the corners the ingrown's that can become irritated at times   ROS      Objective:  Physical Exam  Neurovascular status intact with patient's clinically doing very well with wound edges well coapted hallux and slightly valgus position right with very with some frontal plane deformity but good range of motion with no crepitus of the joint surface noted and slight irritation of the nail borders bilateral     Assessment:  Patient is done well with bunion surgery and has slight residual deformity right but clinically looks really good and there is no pain and for this.  Postop minimal amount of swelling with no pain at the metatarsocuneiform joint     Plan:  H&P x-ray reviewed and we are not able to get complete correction right and she may have lost a little bit of her correction but overall I do think clinically she is done well and she is very pleased with the results.  I then went ahead debrided out carefully the nail borders to take pressure off and she will be seen back 3 months or earlier if needed  X-rays indicate that there has been some residual deformity of the first metatarsal right but the joint itself appears to be well fused with no signs of pathology and structurally both feet look good

## 2018-09-06 ENCOUNTER — Ambulatory Visit: Payer: BLUE CROSS/BLUE SHIELD | Admitting: Podiatry

## 2018-11-08 ENCOUNTER — Other Ambulatory Visit: Payer: Self-pay | Admitting: Family Medicine

## 2018-11-08 ENCOUNTER — Other Ambulatory Visit (HOSPITAL_COMMUNITY)
Admission: RE | Admit: 2018-11-08 | Discharge: 2018-11-08 | Disposition: A | Discharge status: Home / Self Care | Payer: BC Managed Care – PPO | Source: Ambulatory Visit | Attending: Family Medicine | Admitting: Family Medicine

## 2018-11-08 DIAGNOSIS — Z124 Encounter for screening for malignant neoplasm of cervix: Secondary | ICD-10-CM | POA: Insufficient documentation

## 2018-11-08 DIAGNOSIS — Z0000 Encounter for general adult medical examination without abnormal findings: Secondary | ICD-10-CM | POA: Diagnosis not present

## 2018-11-08 DIAGNOSIS — Z13220 Encounter for screening for lipoid disorders: Secondary | ICD-10-CM | POA: Diagnosis not present

## 2018-11-08 DIAGNOSIS — E559 Vitamin D deficiency, unspecified: Secondary | ICD-10-CM | POA: Diagnosis not present

## 2018-11-12 LAB — CYTOLOGY - PAP: Diagnosis: NEGATIVE

## 2018-12-04 DIAGNOSIS — M26633 Articular disc disorder of bilateral temporomandibular joint: Secondary | ICD-10-CM | POA: Diagnosis not present

## 2018-12-05 ENCOUNTER — Other Ambulatory Visit: Payer: Self-pay

## 2018-12-05 ENCOUNTER — Ambulatory Visit (INDEPENDENT_AMBULATORY_CARE_PROVIDER_SITE_OTHER): Payer: BC Managed Care – PPO | Admitting: Podiatry

## 2018-12-05 ENCOUNTER — Encounter: Payer: Self-pay | Admitting: Podiatry

## 2018-12-05 VITALS — Temp 97.3°F

## 2018-12-05 DIAGNOSIS — M2011 Hallux valgus (acquired), right foot: Secondary | ICD-10-CM

## 2018-12-05 DIAGNOSIS — L600 Ingrowing nail: Secondary | ICD-10-CM | POA: Diagnosis not present

## 2018-12-08 NOTE — Progress Notes (Signed)
Subjective:   Patient ID: Anna Parsons, female   DOB: 63 y.o.   MRN: [id number]   HPI Patient states overall doing pretty good with small occasional discomfort if I done too much   ROS      Objective:  Physical Exam  Neurovascular status intact negative Homans sign noted with patient having had extensive foot surgery secondary to structural malalignment with overall good alignment range of motion but it was revisional surgery right     Assessment:  Overall doing well post foot surgery bilateral     Plan:  H&P conditions reviewed and recommended continued range of motion wider type shoes with gradual increase in activity levels and this should continue to improve over the next 6 to 12 months

## 2018-12-26 DIAGNOSIS — L7452 Secondary focal hyperhidrosis: Secondary | ICD-10-CM | POA: Diagnosis not present

## 2018-12-26 DIAGNOSIS — L989 Disorder of the skin and subcutaneous tissue, unspecified: Secondary | ICD-10-CM | POA: Diagnosis not present

## 2019-01-13 DIAGNOSIS — D692 Other nonthrombocytopenic purpura: Secondary | ICD-10-CM | POA: Diagnosis not present

## 2019-01-13 DIAGNOSIS — L308 Other specified dermatitis: Secondary | ICD-10-CM | POA: Diagnosis not present

## 2019-01-16 ENCOUNTER — Encounter: Payer: Self-pay | Admitting: Neurology

## 2019-01-16 ENCOUNTER — Other Ambulatory Visit: Payer: Self-pay

## 2019-01-16 ENCOUNTER — Ambulatory Visit: Payer: BC Managed Care – PPO | Admitting: Neurology

## 2019-01-16 ENCOUNTER — Telehealth: Payer: Self-pay | Admitting: Neurology

## 2019-01-16 VITALS — BP 136/75 | HR 58 | Temp 96.2°F | Ht 61.0 in | Wt 144.5 lb

## 2019-01-16 DIAGNOSIS — R61 Generalized hyperhidrosis: Secondary | ICD-10-CM

## 2019-01-16 NOTE — Progress Notes (Signed)
PATIENT: Anna Parsons DOB: [birth year]  Chief Complaint  Patient presents with  . 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 Zachery Dauer, Lanora Manis for excessive sweating of left ear, initial evaluation was on January 16, 2019.  I have reviewed and summarized the referring note from the referring physician.  She is taking low-dose Lexapro for depression.  Around 2019, she noticed dripping of the fluid out of her left ear when she chew, she has to wipe her ear frequently, she was initially evaluated by Dr. Annalee Genta, there was no significant abnormality found, over the past few months, her left ear drooping gradually getting worse, sometimes blocking her hearing, audiology testing was normal, she denies a history of left parotid gland surgery,  She was referred to oral surgeon Dr. 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  During the evaluation with Dr. Molli Hazard on December 04, 2018, patient was giving a small candy bar to eat while in clinic, on doing so, demonstrated production of approximately 0.5 cc of a clear discharge from the anterior wall of her external auditory canal, middle ear appears dry on examination,  REVIEW OF SYSTEMS: Full 14 system review of systems performed and notable only for as above All other review of systems were negative.  ALLERGIES: No Known Allergies  HOME MEDICATIONS: Current Outpatient Medications  Medication Sig Dispense Refill  . CALCIUM PO Take 1 tablet by mouth daily.    Marland Kitchen escitalopram (LEXAPRO) 20 MG tablet Take 20 mg by mouth daily.    . fluticasone  (FLONASE) 50 MCG/ACT nasal spray USE 2 SPRAYS INTO EACH NOSTRIL EVERY DAY    . Loratadine (CLARITIN PO) Take 1 tablet by mouth daily.    Marland Kitchen neomycin-polymyxin-hydrocortisone (CORTISPORIN) OTIC solution Apply 1-2 drops to toe after soaking BID 10 mL 1   No current facility-administered medications for this visit.     PAST MEDICAL HISTORY: No past medical history on file.  PAST SURGICAL HISTORY: No past surgical history on file.  FAMILY HISTORY: No family history on file.  SOCIAL HISTORY: Social History   Socioeconomic History  . Marital status: Married    Spouse name: Not on file  . Number of children: Not on file  . Years of education: Not on file  . Highest education level: Not on file  Occupational History  . Not on file  Social Needs  . Financial resource strain: Not on file  . Food insecurity    Worry: Not on file    Inability: Not on file  . Transportation needs    Medical: Not on file    Non-medical: Not on file  Tobacco Use  . Smoking status: Never Smoker  . Smokeless tobacco: Never Used  Substance and Sexual Activity  . Alcohol use: Not on file  . Drug use: Not on file  . Sexual activity: Not on file  Lifestyle  . Physical activity    Days per week: Not on file    Minutes per session: Not on file  . Stress: Not on file  Relationships  . Social connections    Talks on phone: Not on  file    Gets together: Not on file    Attends religious service: Not on file    Active member of club or organization: Not on file    Attends meetings of clubs or organizations: Not on file    Relationship status: Not on file  . Intimate partner violence    Fear of current or ex partner: Not on file    Emotionally abused: Not on file    Physically abused: Not on file    Forced sexual activity: Not on file  Other Topics Concern  . Not on file  Social History Narrative  . Not on file     PHYSICAL EXAM   Vitals:   01/16/19 0858  BP: 136/75  Pulse: (!) 58  Temp:  (!) 96.2 F (35.7 C)  Weight: 144 lb 8 oz (65.5 kg)  Height: 5\' 1"  (1.549 m)    Not recorded      Body mass index is 27.3 kg/m.  PHYSICAL EXAMNIATION:  Gen: NAD, conversant, well nourised, obese, well groomed                     Cardiovascular: Regular rate rhythm, no peripheral edema, warm, nontender. Eyes: Conjunctivae clear without exudates or hemorrhage Neck: Supple, no carotid bruits. Pulmonary: Clear to auscultation bilaterally   NEUROLOGICAL EXAM:  MENTAL STATUS: Speech:    Speech is normal; fluent and spontaneous with normal comprehension.  Cognition:     Orientation to time, place and person     Normal recent and remote memory     Normal Attention span and concentration     Normal Language, naming, repeating,spontaneous speech     Fund of knowledge   CRANIAL NERVES: CN II: Visual fields are full to confrontation.  Pupils are round equal and briskly reactive to light. CN III, IV, VI: extraocular movement are normal. No ptosis. CN V: Facial sensation is intact to pinprick in all 3 divisions bilaterally. Corneal responses are intact.  CN VII: Face is symmetric with normal eye closure and smile. CN VIII: Hearing is normal to rubbing fingers CN IX, X: Palate elevates symmetrically. Phonation is normal. CN XI: Head turning and shoulder shrug are intact CN XII: Tongue is midline with normal movements and no atrophy.  MOTOR: There is no pronator drift of out-stretched arms. Muscle bulk and tone are normal. Muscle strength is normal.  REFLEXES: Reflexes are 2+ and symmetric at the biceps, triceps, knees, and ankles. Plantar responses are flexor.  SENSORY: Intact to light touch, pinprick, positional sensation and vibratory sensation are intact in fingers and toes.  COORDINATION: Rapid alternating movements and fine finger movements are intact. There is no dysmetria on finger-to-nose and heel-knee-shin.    GAIT/STANCE: Posture is normal. Gait is steady with normal  steps, base, arm swing, and turning. Heel and toe walking are normal. Tandem gait is normal.  Romberg is absent.   DIAGNOSTIC DATA (LABS, IMAGING, TESTING) - I reviewed patient records, labs, notes, testing and imaging myself where available.   ASSESSMENT AND PLAN  Teddie L Ayllon is a 63 y.o. female   Left external auditory canal/face excessive sweat with chewing,  Gustatory sweating, she has excessive sweating around left ear when chewing.  She denies a history of left parotid gland surgery  MRI of brain w/wo to rule out structure lesion left skull base/brainstem  EMG guided BOTOX Injection   Levert Feinstein, M.D. Ph.D.  [facility] [address], [address] [place], [place] [address]  Ph: [phone] Fax: [phone]  CC: Juluis Rainier, MD

## 2019-01-16 NOTE — Telephone Encounter (Signed)
error 

## 2019-01-21 ENCOUNTER — Telehealth: Payer: Self-pay | Admitting: Neurology

## 2019-01-21 NOTE — Telephone Encounter (Signed)
[employer] pending faxed notes.  

## 2019-01-22 NOTE — Telephone Encounter (Signed)
Ok to hold off MRI brain for right now, let me know after her BOTOX is approved, then we can put her on my schedule for injection

## 2019-01-22 NOTE — Telephone Encounter (Signed)
Noted, working on it 

## 2019-01-22 NOTE — Telephone Encounter (Signed)
Dillon Bjork: [id number] (exp. 01/21/19 to 07/19/19)   I spoke to the patient and informed it would cost her about $834.93 for the MRI because her deductible has not been met I ddi offer her the payment plan. She stated that she would like to hold off on the MRI and try Botox. She stated if the botox does not work then she will proceed on with the MRI. Please advise.

## 2019-01-23 NOTE — Telephone Encounter (Signed)
She has been scheduled for 02/12/2019 at 1pm.  Her Botox approval is pending.  If she is not approved, Botox samples will be used.  The patient will need to be called and her appt confirmed once we have medication here.

## 2019-01-27 ENCOUNTER — Telehealth: Payer: Self-pay | Admitting: Neurology

## 2019-01-27 DIAGNOSIS — Z803 Family history of malignant neoplasm of breast: Secondary | ICD-10-CM | POA: Diagnosis not present

## 2019-01-27 DIAGNOSIS — Z780 Asymptomatic menopausal state: Secondary | ICD-10-CM | POA: Diagnosis not present

## 2019-01-27 DIAGNOSIS — Z1231 Encounter for screening mammogram for malignant neoplasm of breast: Secondary | ICD-10-CM | POA: Diagnosis not present

## 2019-01-29 NOTE — Telephone Encounter (Signed)
Noted, thank you. DW  °

## 2019-02-03 NOTE — Addendum Note (Signed)
Addended by: Marcial Pacas on: 02/03/2019 05:29 PM   Modules accepted: Level of Service

## 2019-02-12 ENCOUNTER — Other Ambulatory Visit: Payer: Self-pay

## 2019-02-12 ENCOUNTER — Ambulatory Visit: Payer: BC Managed Care – PPO | Admitting: Neurology

## 2019-02-12 ENCOUNTER — Encounter: Payer: Self-pay | Admitting: Neurology

## 2019-02-12 VITALS — BP 128/78 | HR 68 | Temp 97.8°F | Ht 61.0 in | Wt 143.8 lb

## 2019-02-12 DIAGNOSIS — R61 Generalized hyperhidrosis: Secondary | ICD-10-CM

## 2019-02-12 NOTE — Progress Notes (Signed)
**  Xeomin 50 units x 1 vial, NDC 5697-9480-16, Lot [id number], Exp 11/2020, sample medication.//mck,rn**

## 2019-02-12 NOTE — Progress Notes (Signed)
PATIENT: Anna Parsons DOB: Jun 29, 1955  Chief Complaint  Patient presents with  . Excessive Sweating    Xeomin 50 units x 1 vial - samples     HISTORICAL  Anna Parsons is a 63 year old female, seen in request by her primary care physician Dr. Zachery DauerBarnes, Lanora ManisElizabeth for excessive sweating of left ear, initial evaluation was on January 16, 2019.  I have reviewed and summarized the referring note from the referring physician.  She is taking low-dose Lexapro for depression.  Around 2019, she noticed dripping of the fluid out of her left ear when she chew, she has to wipe her ear frequently, she was initially evaluated by Dr. Annalee GentaShoemaker, there was no significant abnormality found, over the past few months, her left ear drooping gradually getting worse, sometimes blocking her hearing, audiology testing was normal, she denies a history of left parotid gland surgery,  She was 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  During the evaluation with Dr. Molli HazardMatthew on December 04, 2018, patient was giving a small candy bar to eat while in clinic, on doing so, demonstrated production of approximately 0.5 cc of a clear discharge from the anterior wall of her left external auditory canal, middle ear appears dry on examination,  UPDATE Sept 2 2020: We attempted mapping with iodine dusted with cornstarch, patient was given apple juice, and goldfish, there was no liquid secretion noted at left ear, 50 units of Xeomin sample was used, dissolved into 1 cc of normal saline, injection was placed at 4 injection sites, along anterior wall of left external auditory canal at 10, 9, 8, 6:00, for total 10 units, patient tolerated injection well  REVIEW OF SYSTEMS:  Full 14 system review of systems performed and notable only for as above All other review of systems were negative.  ALLERGIES: No Known Allergies  HOME MEDICATIONS: Current Outpatient Medications  Medication Sig Dispense Refill  . CALCIUM PO Take 1 tablet by mouth daily.    Marland Kitchen. escitalopram (LEXAPRO) 20 MG tablet Take 20 mg by mouth daily.    . fluticasone (FLONASE) 50 MCG/ACT nasal spray USE 2 SPRAYS INTO EACH NOSTRIL EVERY DAY    . incobotulinumtoxinA (XEOMIN) 50 units SOLR injection Inject 50 Units into the muscle every 3 (three) months.    . Loratadine (CLARITIN PO) Take 1 tablet by mouth daily.    Marland Kitchen. neomycin-polymyxin-hydrocortisone (CORTISPORIN) OTIC solution Apply 1-2 drops to toe after soaking BID 10 mL 1   No current facility-administered medications for this visit.     PAST MEDICAL HISTORY: History reviewed. No pertinent past medical history.  PAST SURGICAL HISTORY: History reviewed. No pertinent surgical history.  FAMILY HISTORY: History reviewed. No pertinent family history.  SOCIAL HISTORY: Social History   Socioeconomic History  . Marital status: Married    Spouse name: Not on file  . Number of children: Not on file  . Years of education: Not on file  . Highest education level: Not on file  Occupational History  . Not on file  Social Needs  . Financial resource strain: Not on file  . Food insecurity    Worry: Not on file    Inability: Not on file  . Transportation needs    Medical: Not on file  Non-medical: Not on file  Tobacco Use  . Smoking status: Never Smoker  . Smokeless tobacco: Never Used  Substance and Sexual Activity  . Alcohol use: Not on file  . Drug use: Not on file  . Sexual activity: Not on file  Lifestyle  . Physical activity    Days per week: Not on file    Minutes per session: Not on file  . Stress: Not on file  Relationships  . Social Herbalist on phone: Not on file    Gets together: Not on file    Attends  religious service: Not on file    Active member of club or organization: Not on file    Attends meetings of clubs or organizations: Not on file    Relationship status: Not on file  . Intimate partner violence    Fear of current or ex partner: Not on file    Emotionally abused: Not on file    Physically abused: Not on file    Forced sexual activity: Not on file  Other Topics Concern  . Not on file  Social History Narrative  . Not on file     PHYSICAL EXAM   Vitals:   02/12/19 1301  Temp: 97.8 F (36.6 C)  Weight: 143 lb 12.8 oz (65.2 kg)  Height: 5\' 1"  (1.549 m)    Not recorded      Body mass index is 27.17 kg/m.  PHYSICAL EXAMNIATION AND INJECTION:  Left external auditory canal, left tympanic membrane was intact, there was dry flake cerumen noted at 6 o'clock position, the left external auditory canal was prepared with iodine, after iodine was dried, it was dusted with thin layer of cornstarch, patient was given apple juice and cornstarch, after waiting for 10 minutes, there was no secretion noted at auditory canal, injection was placed at 6, 8, 9, 10:00 location, 2.5 units each, for total of 10 units, patient tolerated injection well,     Marcial Pacas, M.D. Ph.D.  [facility] [address], [place], [place] [address] Ph: [phone] Fax: [phone]  CC: Leighton Ruff, MD

## 2019-02-21 DIAGNOSIS — E559 Vitamin D deficiency, unspecified: Secondary | ICD-10-CM | POA: Diagnosis not present

## 2019-02-21 DIAGNOSIS — E7800 Pure hypercholesterolemia, unspecified: Secondary | ICD-10-CM | POA: Diagnosis not present

## 2019-03-07 DIAGNOSIS — M75122 Complete rotator cuff tear or rupture of left shoulder, not specified as traumatic: Secondary | ICD-10-CM | POA: Diagnosis not present

## 2019-03-07 DIAGNOSIS — M19012 Primary osteoarthritis, left shoulder: Secondary | ICD-10-CM | POA: Diagnosis not present

## 2019-05-16 ENCOUNTER — Telehealth: Payer: Self-pay | Admitting: Neurology

## 2019-05-16 NOTE — Telephone Encounter (Signed)
Pt called to cancel he XEOMIN injection not needed, she stated it was not needed at this time

## 2019-05-19 NOTE — Telephone Encounter (Signed)
Noted, thank you

## 2019-05-21 ENCOUNTER — Ambulatory Visit: Payer: BC Managed Care – PPO | Admitting: Neurology

## 2019-11-03 ENCOUNTER — Other Ambulatory Visit: Payer: Self-pay | Admitting: Podiatry

## 2019-11-03 ENCOUNTER — Encounter: Payer: Self-pay | Admitting: Podiatry

## 2019-11-03 ENCOUNTER — Other Ambulatory Visit: Payer: Self-pay

## 2019-11-03 ENCOUNTER — Ambulatory Visit (INDEPENDENT_AMBULATORY_CARE_PROVIDER_SITE_OTHER): Payer: 59

## 2019-11-03 ENCOUNTER — Ambulatory Visit (INDEPENDENT_AMBULATORY_CARE_PROVIDER_SITE_OTHER): Payer: 59 | Admitting: Podiatry

## 2019-11-03 VITALS — Temp 98.0°F

## 2019-11-03 DIAGNOSIS — M779 Enthesopathy, unspecified: Secondary | ICD-10-CM

## 2019-11-03 DIAGNOSIS — R52 Pain, unspecified: Secondary | ICD-10-CM

## 2019-11-05 NOTE — Progress Notes (Signed)
Subjective:   Patient ID: Anna Parsons, female   DOB: 64 y.o.   MRN: [id number]   HPI Patient states she gets pain around her big toe joints of both feet and states that it seems to be about the same on both.  It seems to extend from around the midfoot distal and she cannot give an absolute specific which is states it is bothersome.  Patient does feel better in shoes than she did before surgery   ROS      Objective:  Physical Exam  We did revisional surgery on her right foot with Lapidus fusion with what appears to be mild reoccurrence or loss of correction and had structural bunion deformity left which looks good.  She has pain when I pressed into the first MPJ bilateral mostly in the lateral side and slightly proximal to this point     Assessment:  Inflammatory capsulitis which may just be low-grade into the first MPJ bilateral     Plan:  H&P x-ray reviewed we will get a try simple treatment first and I did sterile prep and injected around the first MPJ bilateral 3 mg Kenalog 5 mg Xylocaine advised on wider shoes and reappoint for [place] to recheck again in about 2 months.  Placed on oral anti-inflammatory also  X-rays did indicate there is been some reoccurrence deformity right but it was revisional surgery which may have played a part in this.  Left looks good with good alignment noted with fixation in place bilateral no indications of nonunion

## 2020-01-09 ENCOUNTER — Encounter: Payer: Self-pay | Admitting: Podiatry

## 2020-01-09 ENCOUNTER — Ambulatory Visit (INDEPENDENT_AMBULATORY_CARE_PROVIDER_SITE_OTHER): Payer: No Typology Code available for payment source | Admitting: Podiatry

## 2020-01-09 ENCOUNTER — Other Ambulatory Visit: Payer: Self-pay

## 2020-01-09 DIAGNOSIS — M779 Enthesopathy, unspecified: Secondary | ICD-10-CM | POA: Diagnosis not present

## 2020-02-10 NOTE — Progress Notes (Signed)
Subjective:   Patient ID: Anna Parsons, female   DOB: 64 y.o.   MRN: [id number]   HPI Patient states the injections did seem to make a big difference for her and left foot feels better the right is still little bit sore if she is on it too much   ROS      Objective:  Physical Exam  Neurovascular status intact with diminishment of discomfort bilateral with patient having had chronic discomfort and significant surgery due to severe foot structural condition with good range of motion and reduction of subcu inflammation     Assessment:  Improving capsulitis with continued treatment and continued recovery from surgery     Plan:  Reviewed conditions and at this point I am going to let her go and I recommended continued orthotic usage rigid bottom shoes anti-inflammatories and topical as needed patient patient is to be seen back on an as-needed basis

## 2020-03-09 IMAGING — CT CT TEMPORAL BONES W/O CM
3 of 7 series · 12 of 40 positions shown, 14 images · non-contrast
Comparison: None.

CLINICAL DATA: 62-year-old female with intermittent nonpainful left
ear drainage for 1 year.

EXAM:
CT TEMPORAL BONES WITHOUT CONTRAST
TECHNIQUE: Axial and coronal plane CT imaging of the petrous temporal bones was
performed with thin-collimation image reconstruction. No intravenous
contrast was administered. Multiplanar CT image reconstructions were
also generated.

[Series 3: temporal bones 0.60 hr60 ax bone · axial · 0.43mm/px · z∈[-593,-548]mm · 7 of 100 slices shown, 9 images]
[im 13/100  brain]
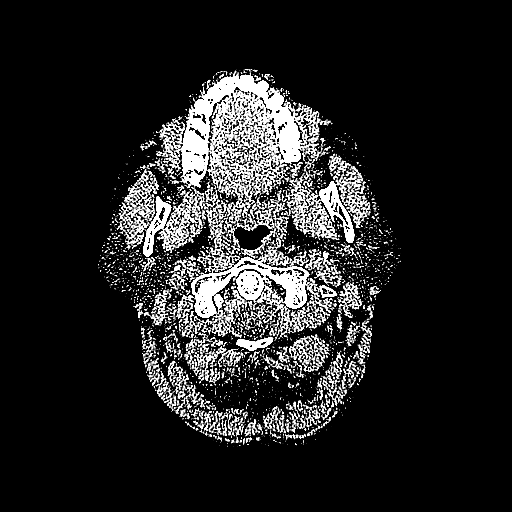
[im 13/100  bone]
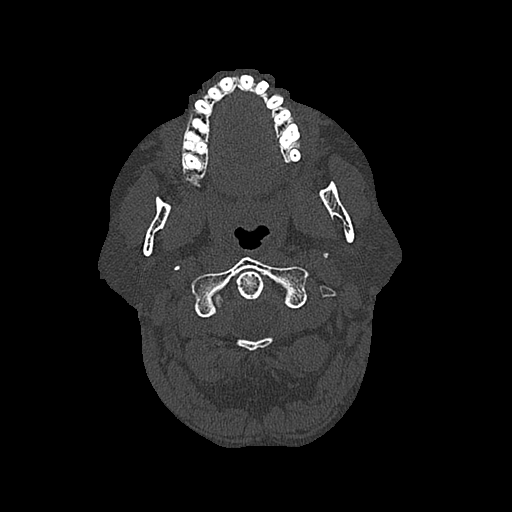
[im 25/100  bone]
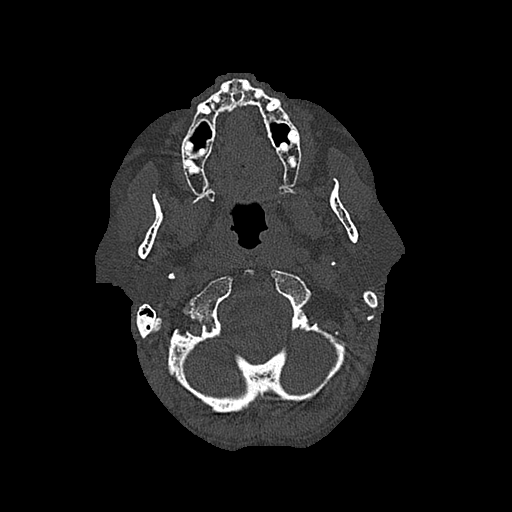
[im 38/100  bone]
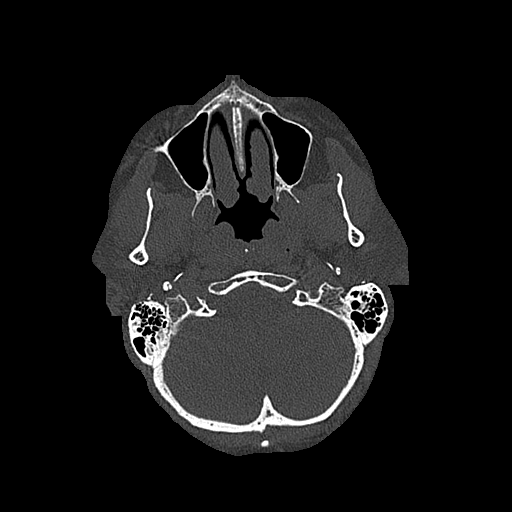
[im 50/100  bone]
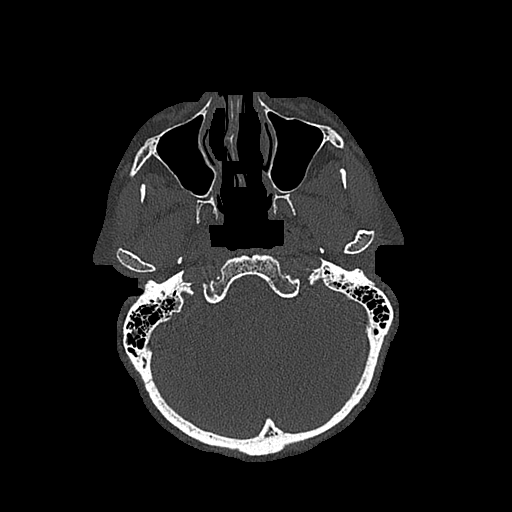
[im 62/100  brain]
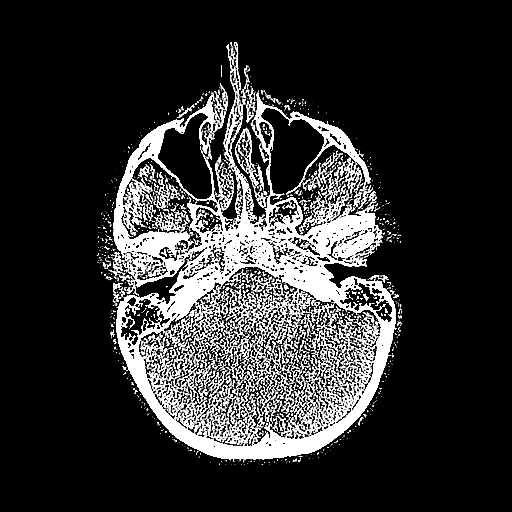
[im 62/100  bone]
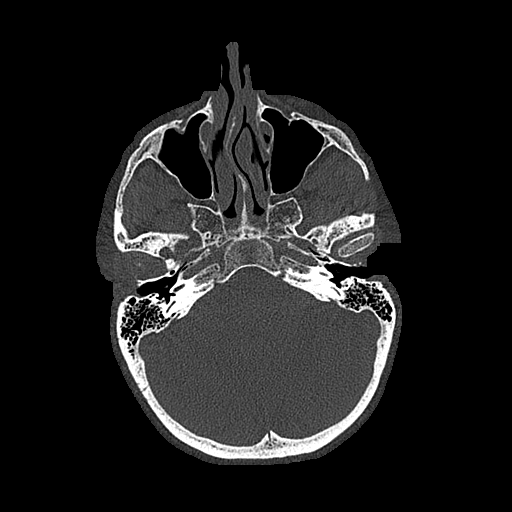
[im 75/100  bone]
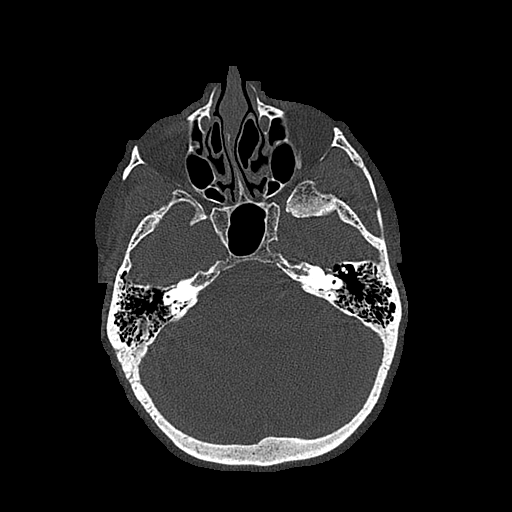
[im 87/100  bone]
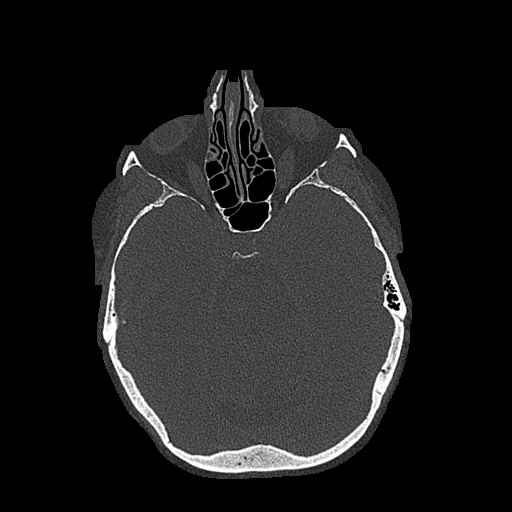

[Series 5: temporal bones 0.80 hr60 cor cor soft · coronal · 0.12mm/px · 3 of 335 slices shown]
[im 67/335  bone]
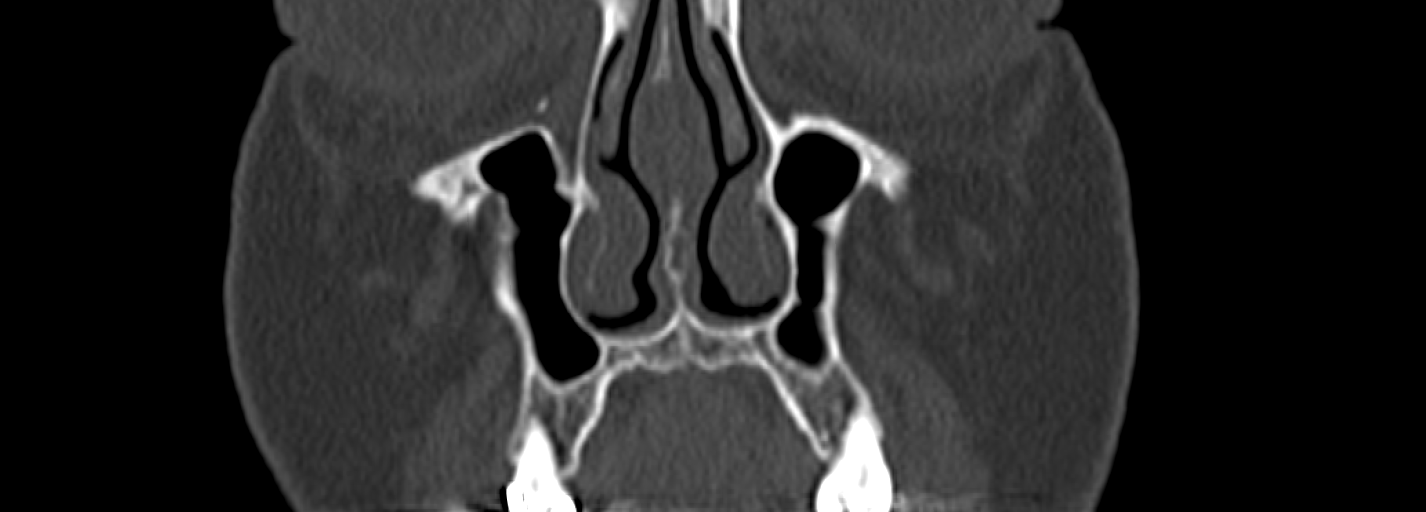
[im 134/335  bone]
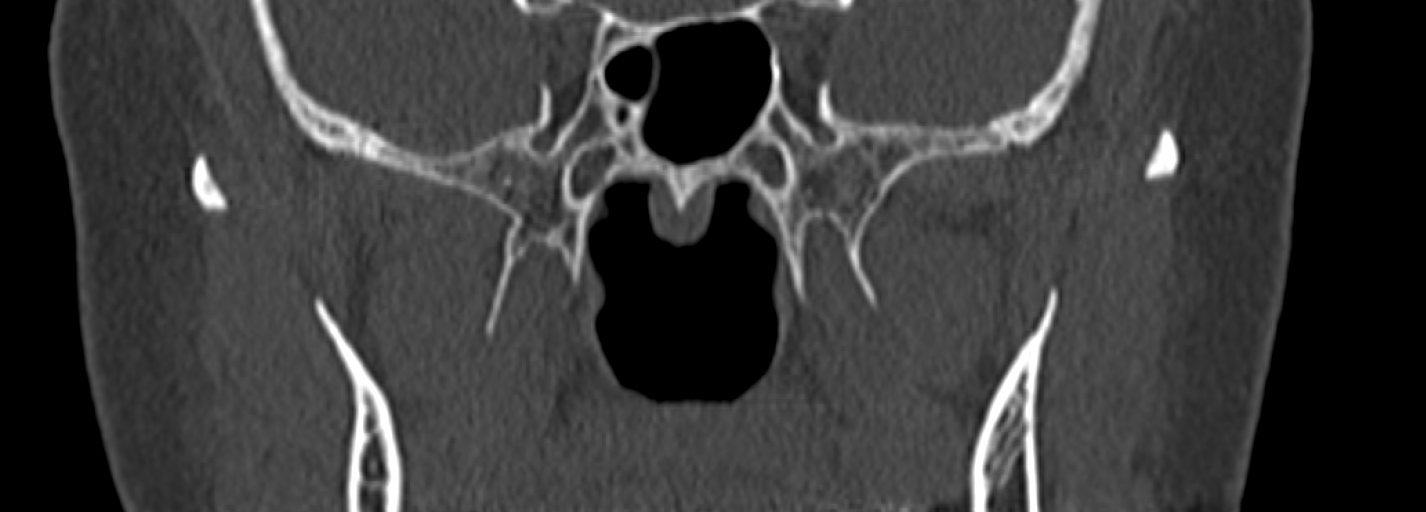
[im 201/335  bone]
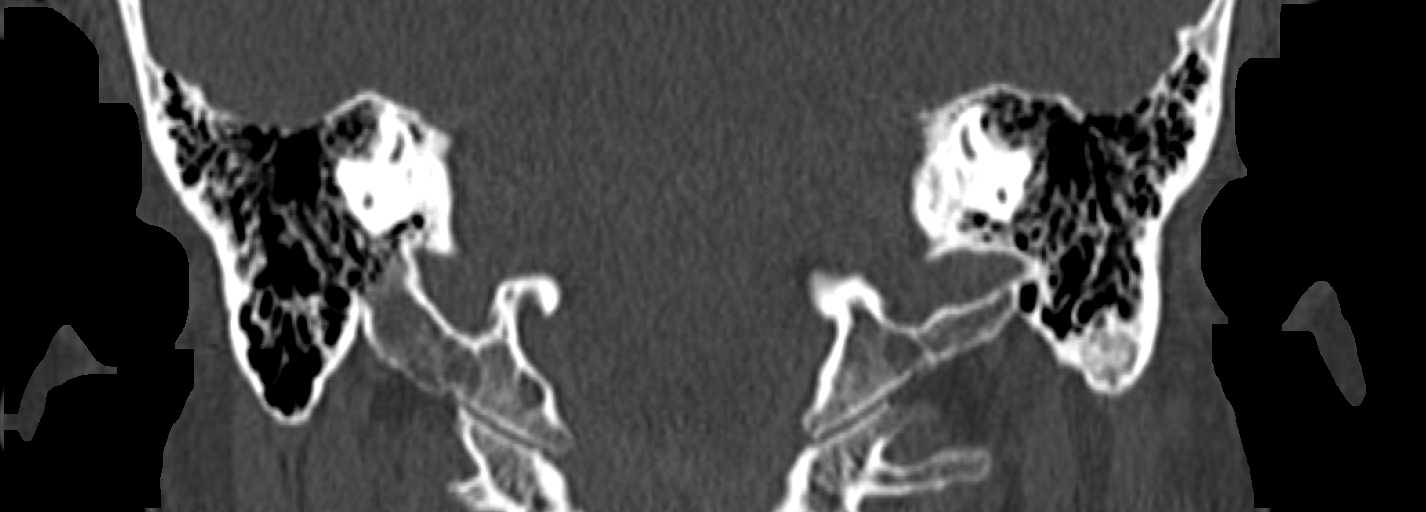

[Series 9: temporal bones 0.60 hr60 ax mag right · axial · 0.20mm/px · z∈[-592,-585]mm · 2 of 99 slices shown]
[im 13/99  bone]
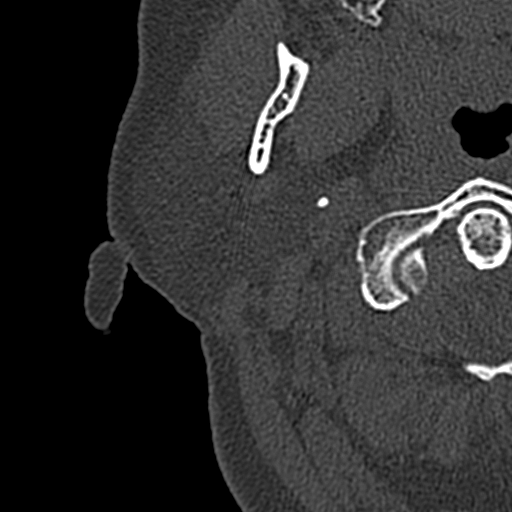
[im 25/99  bone]
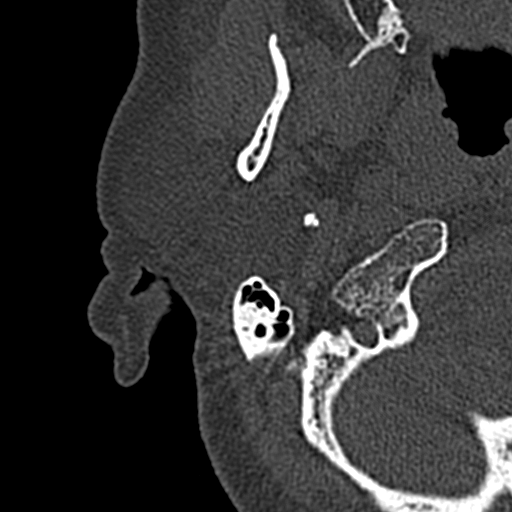

[12 of 40 positions shown; findings below may reference images not displayed]

FINDINGS: Negative visible noncontrast brain parenchyma. Visualized orbit soft
tissues are within normal limits. Negative visible noncontrast deep
soft tissue spaces of the face. The visible periauricular soft
tissues appear normal.

Visible skull bone mineralization appears normal. The visible
paranasal sinuses are clear.

RIGHT TEMPORAL BONE:

Normal right EAC. The right tympanic membrane, tympanic cavity, and
ossicles appear normal. The right mastoid antrum and air cells are
clear. The right IAC, cochlea, vestibule and vestibular aqueduct are
within normal limits. Normal right semicircular canals, and course
of the right 7th nerve.

LEFT TEMPORAL BONE:

Left EAC is within normal limits. The left tympanic membrane appears
normal. The left tympanic cavity is clear. The left ossicles are
intact and normally aligned. The scutum is intact.

The left mastoid antrum and air cells are clear. The left IAC,
cochlea, vestibule and vestibular aqueduct are normal. Normal left
semicircular canals, and course of the left 7th nerve.
IMPRESSION: Bilateral middle ears and temporal bones appear symmetric and
normal. No abnormality identified.
